# Patient Record
Sex: Female | Born: 1950 | Race: White | Hispanic: No | Marital: Married | Smoking: Never smoker
Health system: Southern US, Community
[De-identification: ages and names within clinical notes are randomized; demographics above are authoritative.]

## PROBLEM LIST (undated history)

## (undated) DIAGNOSIS — E079 Disorder of thyroid, unspecified: Secondary | ICD-10-CM

## (undated) DIAGNOSIS — F32A Depression, unspecified: Secondary | ICD-10-CM

## (undated) DIAGNOSIS — F329 Major depressive disorder, single episode, unspecified: Secondary | ICD-10-CM

## (undated) HISTORY — PX: THYROIDECTOMY: SHX17

---

## 2008-12-11 ENCOUNTER — Ambulatory Visit: Payer: Self-pay | Admitting: Internal Medicine

## 2009-07-03 ENCOUNTER — Ambulatory Visit: Payer: Self-pay | Admitting: Internal Medicine

## 2009-12-07 ENCOUNTER — Ambulatory Visit: Payer: Self-pay | Admitting: Internal Medicine

## 2011-01-18 ENCOUNTER — Ambulatory Visit: Payer: Self-pay | Admitting: Gastroenterology

## 2014-04-07 ENCOUNTER — Ambulatory Visit: Payer: Self-pay | Admitting: Family Medicine

## 2014-11-13 ENCOUNTER — Emergency Department
Admission: EM | Admit: 2014-11-13 | Discharge: 2014-11-13 | Disposition: A | Discharge status: Home / Self Care | Attending: Emergency Medicine | Admitting: Emergency Medicine

## 2014-11-13 ENCOUNTER — Emergency Department

## 2014-11-13 DIAGNOSIS — R202 Paresthesia of skin: Secondary | ICD-10-CM | POA: Insufficient documentation

## 2014-11-13 DIAGNOSIS — R200 Anesthesia of skin: Secondary | ICD-10-CM | POA: Diagnosis not present

## 2014-11-13 DIAGNOSIS — R531 Weakness: Secondary | ICD-10-CM | POA: Diagnosis present

## 2014-11-13 HISTORY — DX: Depression, unspecified: F32.A

## 2014-11-13 HISTORY — DX: Disorder of thyroid, unspecified: E07.9

## 2014-11-13 HISTORY — DX: Major depressive disorder, single episode, unspecified: F32.9

## 2014-11-13 LAB — CBC WITH DIFFERENTIAL/PLATELET
BASOS ABS: 0 10*3/uL (ref 0–0.1)
BASOS PCT: 1 %
EOS ABS: 0 10*3/uL (ref 0–0.7)
EOS PCT: 0 %
HCT: 39 % (ref 35.0–47.0)
HEMOGLOBIN: 13 g/dL (ref 12.0–16.0)
Lymphocytes Relative: 32 %
Lymphs Abs: 1.8 10*3/uL (ref 1.0–3.6)
MCH: 32.3 pg (ref 26.0–34.0)
MCHC: 33.5 g/dL (ref 32.0–36.0)
MCV: 96.6 fL (ref 80.0–100.0)
Monocytes Absolute: 0.6 10*3/uL (ref 0.2–0.9)
Monocytes Relative: 10 %
NEUTROS PCT: 57 %
Neutro Abs: 3.2 10*3/uL (ref 1.4–6.5)
PLATELETS: 317 10*3/uL (ref 150–440)
RBC: 4.03 MIL/uL (ref 3.80–5.20)
RDW: 12.8 % (ref 11.5–14.5)
WBC: 5.7 10*3/uL (ref 3.6–11.0)

## 2014-11-13 LAB — BASIC METABOLIC PANEL
Anion gap: 9 (ref 5–15)
BUN: 12 mg/dL (ref 6–20)
CALCIUM: 9 mg/dL (ref 8.9–10.3)
CHLORIDE: 94 mmol/L — AB (ref 101–111)
CO2: 27 mmol/L (ref 22–32)
CREATININE: 0.86 mg/dL (ref 0.44–1.00)
Glucose, Bld: 96 mg/dL (ref 65–99)
Potassium: 4 mmol/L (ref 3.5–5.1)
SODIUM: 130 mmol/L — AB (ref 135–145)

## 2014-11-13 LAB — TROPONIN I

## 2014-11-13 LAB — TSH: TSH: 3.938 u[IU]/mL (ref 0.350–4.500)

## 2014-11-13 LAB — MAGNESIUM: Magnesium: 2.3 mg/dL (ref 1.7–2.4)

## 2014-11-13 MED ORDER — SODIUM CHLORIDE 0.9 % IV BOLUS (SEPSIS)
500.0000 mL | Freq: Once | INTRAVENOUS | Status: AC
  Administered 2014-11-13: 500 mL via INTRAVENOUS

## 2014-11-13 MED ORDER — LORAZEPAM 1 MG PO TABS: 1.0000 mg | ORAL_TABLET | Freq: Two times a day (BID) | ORAL | Status: DC | PRN

## 2014-11-13 MED ORDER — LORAZEPAM 2 MG/ML IJ SOLN
1.0000 mg | Freq: Once | INTRAMUSCULAR | Status: AC
  Administered 2014-11-13: 1 mg via INTRAVENOUS
  Filled 2014-11-13: qty 1

## 2014-11-13 NOTE — ED Notes (Signed)
MD at bedside. 

## 2014-11-13 NOTE — Discharge Instructions (Signed)

## 2014-11-13 NOTE — ED Provider Notes (Signed)
Time Seen: Approximately ----------------------------------------- 1:06 PM on 11/13/2014 -----------------------------------------    I have reviewed the triage notes  Chief Complaint: Code Stroke   History of Present Illness: Heather Villanueva is a 64 y.o. female who presents with bilateral upper and lower extremity and facial numbness. She states the facial numbness is also bilaterally. She states the numbness and tingling has been occurring for now 2-3 weeks. She states she noticed some subjective weakness today in both upper and lower extremities. She denies any headaches, blurred vision, trouble with speech, or swallowing. She states she has seen her primary physician and was prescribed (for some back discomfort. She has a long history of depression. She denies any suicidal thoughts or homicidal thoughts. She states the weakness in her arms and legs seem to started approximately an hour and a half prior to arrival. She states she feels much better at present in the process of doing her exam.   Past Medical History  Diagnosis Date  . Depression   . Thyroid disease     There are no active problems to display for this patient.   Past Surgical History  Procedure Laterality Date  . Thyroidectomy      Past Surgical History  Procedure Laterality Date  . Thyroidectomy      No current outpatient prescriptions on file.  Allergies:  Review of patient's allergies indicates no known allergies.  Family History: History reviewed. No pertinent family history.  Social History: Social History  Substance Use Topics  . Smoking status: Never Smoker   . Smokeless tobacco: None  . Alcohol Use: No     Review of Systems:   10 point review of systems was performed and was otherwise negative:  Constitutional: No fever Eyes: No visual disturbances ENT: No sore throat, ear pain Cardiac: No chest pain Respiratory: No shortness of breath, wheezing, or stridor Abdomen: No  abdominal pain, no vomiting, No diarrhea Endocrine: No weight loss, No night sweats Extremities: No peripheral edema, cyanosis Skin: No rashes, easy bruising Neurologic: No focal weakness, trouble with speech or swollowing Urologic: No dysuria, Hematuria, or urinary frequency   Physical Exam:  ED Triage Vitals  Enc Vitals Group     BP 11/13/14 1215 160/97 mmHg     Pulse Rate 11/13/14 1218 67     Resp 11/13/14 1218 16     Temp 11/13/14 1218 97.9 F (36.6 C)     Temp Source 11/13/14 1218 Oral     SpO2 11/13/14 1218 99 %     Weight 11/13/14 1218 142 lb (64.411 kg)     Height 11/13/14 1218  (1.676 m)     Head Cir --      Peak Flow --      Pain Score --      Pain Loc --      Pain Edu? --      Excl. in GC? --     General: Awake , Alert , and Oriented times 3; GCS 15. Very anxious Head: Normal cephalic , atraumatic Eyes: Pupils equal , round, reactive to light Nose/Throat: No nasal drainage, patent upper airway without erythema or exudate.  Neck: Supple, Full range of motion, No anterior adenopathy or palpable thyroid masses Lungs: Clear to ascultation without wheezes , rhonchi, or rales Heart: Regular rate, regular rhythm without murmurs , gallops , or rubs Abdomen: Soft, non tender without rebound, guarding , or rigidity; bowel sounds positive and symmetric in all 4 quadrants. No organomegaly .  Extremities: 2 plus symmetric pulses. No edema, clubbing or cyanosis Neurologic: Patient has effort dependent 5 out of 5 strength in both upper and lower extremities which appears to be symmetric. She'll start out by doing a light squeeze on the hand and then with encouragement will squeeze it and flex on both sides. No facial deficits and no sensory deficits are noted. Skin: warm, dry, no rashes   Labs:   All laboratory work was reviewed including any pertinent negatives or positives listed below:  Labs Reviewed  CBC WITH DIFFERENTIAL/PLATELET  BASIC METABOLIC PANEL   TROPONIN I  TSH  MAGNESIUM   review laboratory work showed no significant abnormalities     EKG:  ED ECG REPORT I, Jennye Moccasin, the attending physician, personally viewed and interpreted this ECG.  Date: 11/13/2014 EKG Time: 1222 Rate: 66 Rhythm: normal sinus rhythm QRS Axis: normal Intervals: normal ST/T Wave abnormalities: normal Conduction Disutrbances: none Narrative Interpretation: Normal EKG  Radiology:    EXAM: CT HEAD WITHOUT CONTRAST  TECHNIQUE: Contiguous axial images were obtained from the base of the skull through the vertex without intravenous contrast.  COMPARISON: None.  FINDINGS: No acute intracranial hemorrhage. No focal mass lesion. No CT evidence of acute infarction. No midline shift or mass effect. No hydrocephalus. Basilar cisterns are patent.  There is minimal deep white matter hypodensities.  Paranasal sinuses and mastoid air cells are clear.  IMPRESSION: 1. No acute intracranial findings. 2. Minimal white matter microvascular disease.     I personally reviewed the radiologic studies     ED Course:  The patient's stay here showed some symptomatic improvement with IV Ativan dose she still has some burning and tingling sensation in her peripheries. Patient most likely has some form of paresthesias or peripheral neuropathy  With her symptoms being diffuse I felt this was unlikely to be vascular in nature at this point such as an acute cerebrovascular accident. I felt the patient did not require further inpatient management totally agree with the patient that she'll need neurology follow-up and likely more extensive outpatient testing. Her CT here was negative but there still in her differential includes multiple sclerosis, ALS, transient synovitis, etc. She was advised to return here if she had persistent weakness. She was prescribed Ativan on an outpatient basis for the anxiety component. 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 She was referred to local neurology and advised touch base with her primary physician who may also assist her in outpatient follow-up.   Assessment:  extremity paresthesias     Plan:  Patient was advised to return immediately if condition worsens. Patient was advised to follow up with her primary care physician or other specialized physicians involved and in their current assessment.             Jennye Moccasin, MD 11/13/14 [phone]

## 2014-11-13 NOTE — ED Notes (Signed)
Pt presents via EMS with bilateral lower and upper extremity weakness. States has been having tingling and numbness and waiting for neuro apt in 3 months. States new onset of these symptoms 1.5 hr pta. Denies pain.

## 2014-11-22 ENCOUNTER — Other Ambulatory Visit: Payer: Self-pay | Admitting: Nurse Practitioner

## 2014-11-22 DIAGNOSIS — R531 Weakness: Secondary | ICD-10-CM

## 2014-11-22 DIAGNOSIS — R202 Paresthesia of skin: Secondary | ICD-10-CM

## 2014-11-22 DIAGNOSIS — R200 Anesthesia of skin: Secondary | ICD-10-CM

## 2014-11-30 ENCOUNTER — Ambulatory Visit
Admission: RE | Admit: 2014-11-30 | Discharge: 2014-11-30 | Disposition: A | Discharge status: Home / Self Care | Source: Ambulatory Visit | Attending: Nurse Practitioner | Admitting: Nurse Practitioner

## 2014-11-30 DIAGNOSIS — R202 Paresthesia of skin: Secondary | ICD-10-CM

## 2014-11-30 DIAGNOSIS — R531 Weakness: Secondary | ICD-10-CM

## 2014-11-30 DIAGNOSIS — R200 Anesthesia of skin: Secondary | ICD-10-CM

## 2014-12-14 ENCOUNTER — Ambulatory Visit
Admission: RE | Admit: 2014-12-14 | Discharge: 2014-12-14 | Disposition: A | Discharge status: Home / Self Care | Source: Ambulatory Visit | Attending: Nurse Practitioner | Admitting: Nurse Practitioner

## 2014-12-14 DIAGNOSIS — R200 Anesthesia of skin: Secondary | ICD-10-CM | POA: Diagnosis present

## 2014-12-14 DIAGNOSIS — R202 Paresthesia of skin: Secondary | ICD-10-CM | POA: Insufficient documentation

## 2014-12-14 DIAGNOSIS — R531 Weakness: Secondary | ICD-10-CM | POA: Diagnosis not present

## 2014-12-14 MED ORDER — GADOBENATE DIMEGLUMINE 529 MG/ML IV SOLN
15.0000 mL | Freq: Once | INTRAVENOUS | Status: AC | PRN
  Administered 2014-12-14: 13 mL via INTRAVENOUS

## 2016-01-09 ENCOUNTER — Telehealth: Payer: Self-pay

## 2016-01-09 ENCOUNTER — Ambulatory Visit
Admission: EM | Admit: 2016-01-09 | Discharge: 2016-01-09 | Disposition: A | Discharge status: Home / Self Care | Attending: Family Medicine | Admitting: Family Medicine

## 2016-01-09 DIAGNOSIS — R6889 Other general symptoms and signs: Secondary | ICD-10-CM

## 2016-01-09 DIAGNOSIS — B9789 Other viral agents as the cause of diseases classified elsewhere: Secondary | ICD-10-CM | POA: Diagnosis not present

## 2016-01-09 DIAGNOSIS — J069 Acute upper respiratory infection, unspecified: Secondary | ICD-10-CM | POA: Diagnosis not present

## 2016-01-09 DIAGNOSIS — R69 Illness, unspecified: Secondary | ICD-10-CM

## 2016-01-09 DIAGNOSIS — J111 Influenza due to unidentified influenza virus with other respiratory manifestations: Secondary | ICD-10-CM

## 2016-01-09 LAB — RAPID INFLUENZA A&B ANTIGENS (ARMC ONLY)
INFLUENZA A (ARMC): NEGATIVE
INFLUENZA B (ARMC): NEGATIVE

## 2016-01-09 LAB — RAPID STREP SCREEN (MED CTR MEBANE ONLY): STREPTOCOCCUS, GROUP A SCREEN (DIRECT): NEGATIVE

## 2016-01-09 MED ORDER — MELOXICAM 15 MG PO TABS: 15.0000 mg | ORAL_TABLET | Freq: Every day | ORAL | 1 refills | Status: AC

## 2016-01-09 MED ORDER — OSELTAMIVIR PHOSPHATE 75 MG PO CAPS: 75.0000 mg | ORAL_CAPSULE | Freq: Two times a day (BID) | ORAL | 0 refills | Status: AC

## 2016-01-09 MED ORDER — HYDROCOD POLST-CPM POLST ER 10-8 MG/5ML PO SUER: 5.0000 mL | Freq: Two times a day (BID) | ORAL | 0 refills | Status: AC | PRN

## 2016-01-09 NOTE — ED Triage Notes (Signed)
Flu like symptoms, ear pain, sore throat, body aches, and feels cold all the time. It all started last night.

## 2016-01-09 NOTE — Telephone Encounter (Signed)
Informed patient of negative strep test.

## 2016-01-09 NOTE — ED Provider Notes (Signed)
[facility]    CSN: [id number] Arrival date & time: 01/09/16  1533     History   Chief Complaint Chief Complaint  Patient presents with  . Influenza    HPI Heather Villanueva is a 65 y.o. female.   Patient is here because of flu like symptoms she was fine until yesterday when she started developing aching and general malaise last night she feels worse this morning and feels that this she hurts all over. She did have a flu shot this year but she is coughing sore throat nasal congestion ear pain and just feels miserable. Past smoking history is depression and thyroid disease. Only surgery listed styloidectomy. No known drug allergies and she does not smoke. Past family medical history not significant or relevant to today's visit.   The history is provided by the patient. No language interpreter was used.  Influenza  Presenting symptoms: cough, fatigue, fever, myalgias, rhinorrhea and sore throat   Severity:  Moderate Onset quality:  Sudden Progression:  Worsening Chronicity:  New Relieved by:  Nothing Worsened by:  Nothing Ineffective treatments:  None tried Associated symptoms: chills, decreased appetite, ear pain and nasal congestion     Past Medical History:  Diagnosis Date  . Depression   . Thyroid disease     There are no active problems to display for this patient.   Past Surgical History:  Procedure Laterality Date  . THYROIDECTOMY      OB History    No data available       Home Medications    Prior to Admission medications   Medication Sig Start Date End Date Taking? Authorizing Provider  acetaminophen (TYLENOL) 325 MG tablet Take 650 mg by mouth every 6 (six) hours as needed.   Yes Historical Provider, MD  Genistein-Zn Chelate-Vit D (FOSTEUM PO) Take by mouth.   Yes Historical Provider, MD  levothyroxine (SYNTHROID, LEVOTHROID) 125 MCG tablet Take 125 mcg by mouth daily before breakfast.   Yes Historical Provider, MD    chlorpheniramine-HYDROcodone (TUSSIONEX PENNKINETIC ER) 10-8 MG/5ML SUER Take 5 mLs by mouth every 12 (twelve) hours as needed for cough. 01/09/16   Hassan RowanEugene Aariyah Sampey, MD  meloxicam (MOBIC) 15 MG tablet Take 1 tablet (15 mg total) by mouth daily. 01/09/16   Hassan RowanEugene Sandee Bernath, MD  oseltamivir (TAMIFLU) 75 MG capsule Take 1 capsule (75 mg total) by mouth 2 (two) times daily. 01/09/16   Hassan RowanEugene Zebedee Segundo, MD    Family History History reviewed. No pertinent family history.  Social History Social History  Substance Use Topics  . Smoking status: Never Smoker  . Smokeless tobacco: Never Used  . Alcohol use No     Allergies   Patient has no known allergies.   Review of Systems Review of Systems  Constitutional: Positive for chills, decreased appetite, fatigue and fever.  HENT: Positive for congestion, ear pain, rhinorrhea and sore throat.   Respiratory: Positive for cough.   Musculoskeletal: Positive for myalgias.  All other systems reviewed and are negative.    Physical Exam Triage Vital Signs ED Triage Vitals  Enc Vitals Group     BP 01/09/16 1635 121/69     Pulse Rate 01/09/16 1635 75     Resp 01/09/16 1635 18     Temp 01/09/16 1635 98.1 F (36.7 C)     Temp Source 01/09/16 1635 Oral     SpO2 01/09/16 1635 100 %     Weight 01/09/16 1634 115 lb (52.2 kg)  Height 01/09/16 1634 5\' 6"  (1.676 m)     Head Circumference --      Peak Flow --      Pain Score 01/09/16 1636 0     Pain Loc --      Pain Edu? --      Excl. in GC? --    No data found.   Updated Vital Signs BP 121/69 (BP Location: Left Arm)   Pulse 75   Temp 98.1 F (36.7 C) (Oral)   Resp 18   Ht 5\' 6"  (1.676 m)   Wt 115 lb (52.2 kg)   SpO2 100%   BMI 18.56 kg/m   Visual Acuity Right Eye Distance:   Left Eye Distance:   Bilateral Distance:    Right Eye Near:   Left Eye Near:    Bilateral Near:     Physical Exam  Constitutional: She is oriented to person, place, and time. She appears well-developed and  well-nourished. No distress.  HENT:  Head: Normocephalic and atraumatic.  Right Ear: Hearing, tympanic membrane, external ear and ear canal normal.  Left Ear: Hearing, tympanic membrane, external ear and ear canal normal.  Nose: No mucosal edema or rhinorrhea. Right sinus exhibits no maxillary sinus tenderness and no frontal sinus tenderness. Left sinus exhibits no maxillary sinus tenderness and no frontal sinus tenderness.  Mouth/Throat: Uvula is midline and oropharynx is clear and moist. No oral lesions. No uvula swelling.  Eyes: Pupils are equal, round, and reactive to light.  Neck: Normal range of motion. Neck supple.  Cardiovascular: Normal rate, regular rhythm and normal heart sounds.   Pulmonary/Chest: Effort normal and breath sounds normal.  Musculoskeletal: Normal range of motion.  Neurological: She is alert and oriented to person, place, and time.  Skin: Skin is warm and dry. She is not diaphoretic.  Psychiatric: She has a normal mood and affect.  Vitals reviewed.    UC Treatments / Results  Labs (all labs ordered are listed, but only abnormal results are displayed) Labs Reviewed  RAPID INFLUENZA A&B ANTIGENS ([facility] ONLY)  RAPID STREP SCREEN (NOT AT [facility])  CULTURE, GROUP A STREP [facility])    EKG  EKG Interpretation None       Radiology No results found.  Procedures Procedures (including critical care time)  Medications Ordered in UC Medications - No data to display   Initial Impression / Assessment and Plan / UC Course  I have reviewed the triage vital signs and the nursing notes.  Pertinent labs & imaging results that were available during my care of the patient were reviewed by me and considered in my medical decision making (see chart for details). Results for orders placed or performed during the hospital encounter of 01/09/16  Rapid Influenza A&B Antigens ([facility] only)  Result Value Ref Range   Influenza A ([facility]) NEGATIVE NEGATIVE   Influenza B ([facility])  NEGATIVE NEGATIVE  Rapid strep screen  Result Value Ref Range   Streptococcus, Group A Screen (Direct) NEGATIVE NEGATIVE   Clinical Course     Clinical patient strep test negative flu test negative will treat with Tamiflu and UA place on Tussionex 1 teaspoon twice a day Mobic 15 mg daily. Follow-up PCP if not better in 3-5 days.  Final Clinical Impressions(s) / UC Diagnoses   Final diagnoses:  Flu-like symptoms  Influenza-like illness  Viral upper respiratory tract infection    New Prescriptions Discharge Medication List as of 01/09/2016  5:52 PM    START taking these medications  Details  chlorpheniramine-HYDROcodone (TUSSIONEX PENNKINETIC ER) 10-8 MG/5ML SUER Take 5 mLs by mouth every 12 (twelve) hours as needed for cough., Starting Mon 01/09/2016, Normal    meloxicam (MOBIC) 15 MG tablet Take 1 tablet (15 mg total) by mouth daily., Starting Mon 01/09/2016, Normal    oseltamivir (TAMIFLU) 75 MG capsule Take 1 capsule (75 mg total) by mouth 2 (two) times daily., Starting Mon 01/09/2016, Normal         Hassan Rowan, MD 01/09/16 [phone]

## 2016-01-12 ENCOUNTER — Telehealth: Payer: Self-pay | Admitting: *Deleted

## 2016-01-12 LAB — CULTURE, GROUP A STREP (THRC)

## 2016-01-12 NOTE — Telephone Encounter (Signed)
Called patient, verified DOB, communicated negative strep culture. Patient reported feeling some better, but still having flu symptoms. Patient has followed up with her PCP.

## 2017-06-13 IMAGING — MR MR CERVICAL SPINE WO/W CM
8 of 9 series · 38 of 48 positions shown · IV contrast (13 ML MULTIHANCE)
Comparison: None.

CLINICAL DATA: Numbness and tingling.  Generalized weakness.

EXAM:
MRI CERVICAL SPINE WITHOUT AND WITH CONTRAST
TECHNIQUE: Multiplanar and multiecho pulse sequences of the cervical spine, to
include the craniocervical junction and cervicothoracic junction,
were obtained according to standard protocol without and with
intravenous contrast.
CONTRAST:  13 mL MultiHance IV

[Series 3: T1 · sagittal · 3.0mm · 0.56mm/px · 4 of 13 slices shown (1 of 2)]
[im 1/13]
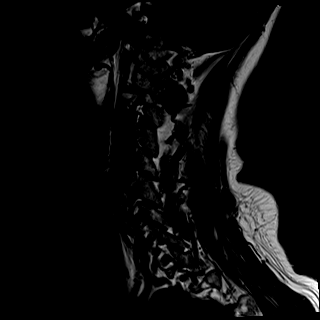
[im 5/13]
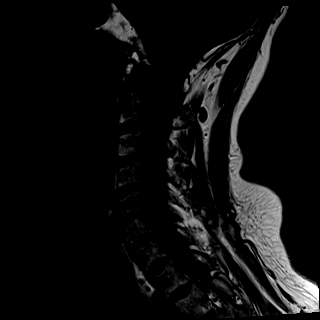
[im 9/13]
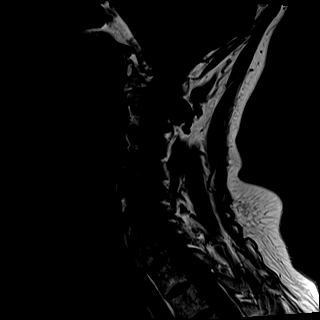
[im 13/13]
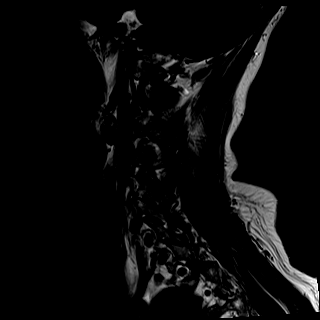

[Series 4: STIR · sagittal · 3.0mm · 0.35mm/px · 4 of 13 slices shown]
[im 1/13]
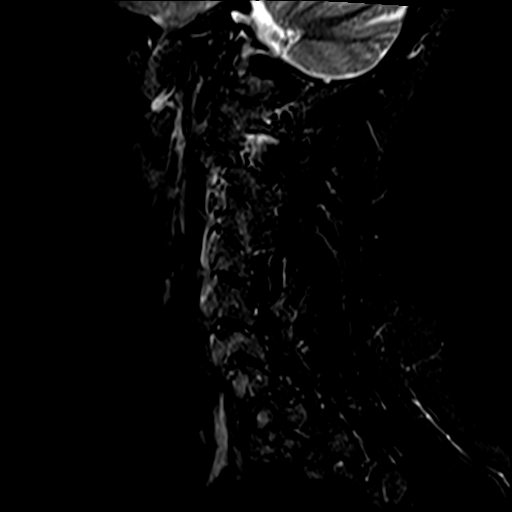
[im 5/13]
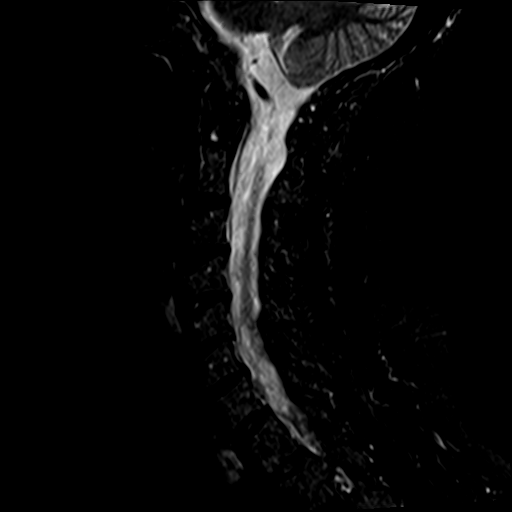
[im 9/13]
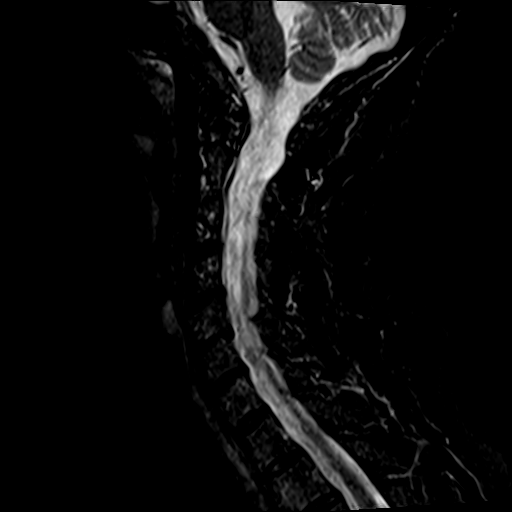
[im 13/13]
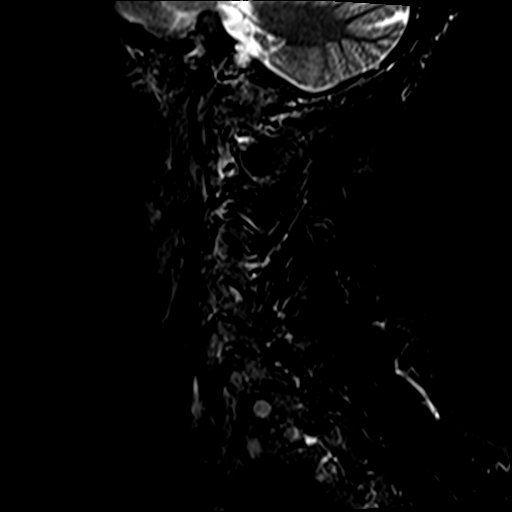

[Series 5: FLAIR · sagittal · 3.0mm · 0.35mm/px · 1 of 13 slices shown]
[im 1/13]
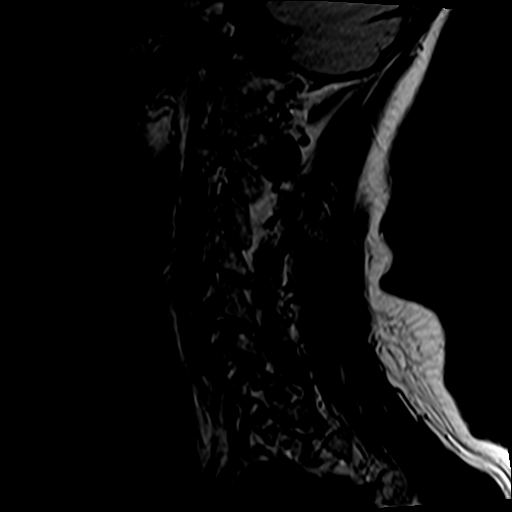

[Series 6: T2 · axial · 3.0mm · 0.62mm/px · z∈[-88,-2]mm · 7 of 25 slices shown (1 of 2)]
[im 1/25]
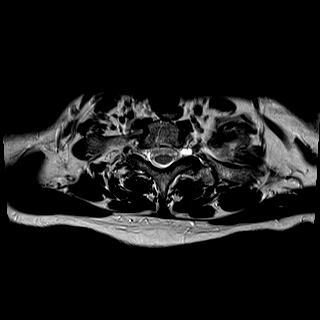
[im 5/25]
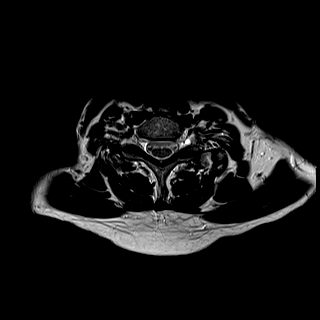
[im 9/25]
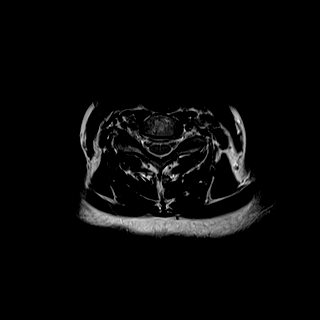
[im 13/25]
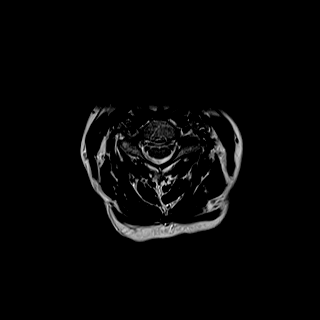
[im 17/25]
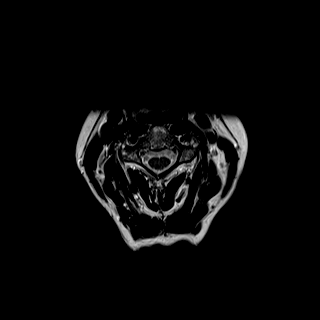
[im 21/25]
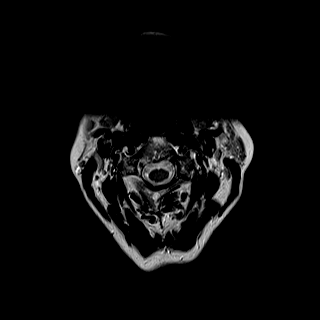
[im 25/25]
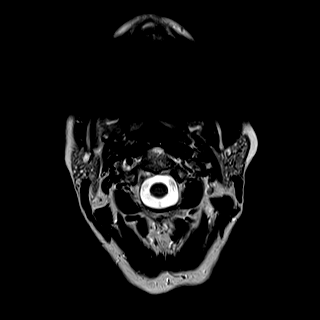

[Series 8: T1 · axial · non-contrast · 3.0mm · 0.62mm/px · z∈[-88,-2]mm · 7 of 25 slices shown (2 of 2)]
[im 1/25]
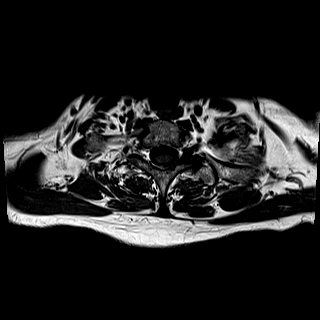
[im 5/25]
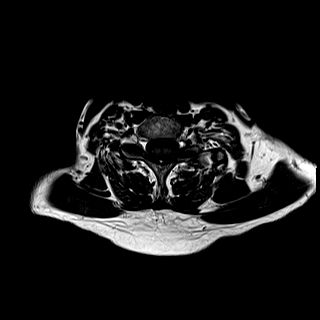
[im 9/25]
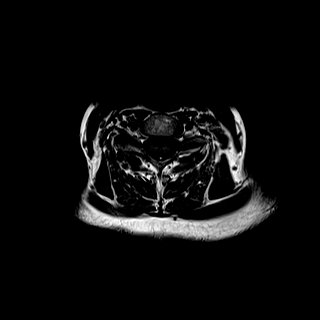
[im 13/25]
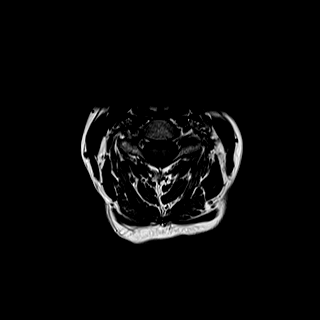
[im 17/25]
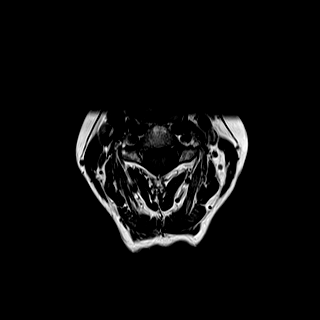
[im 21/25]
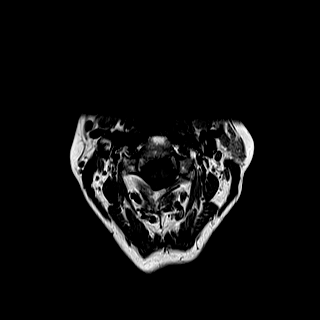
[im 25/25]
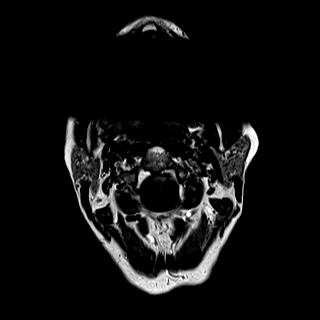

[Series 9: T1 fat-sat post-contrast · sagittal · 3.0mm · 0.56mm/px · 4 of 13 slices shown]
[im 1/13]
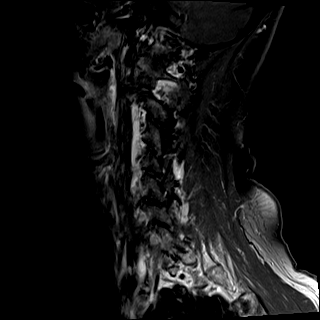
[im 5/13]
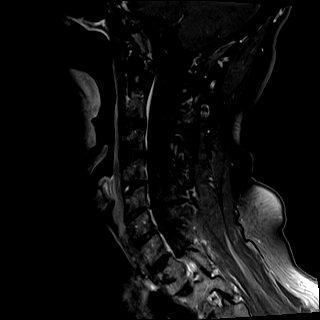
[im 9/13]
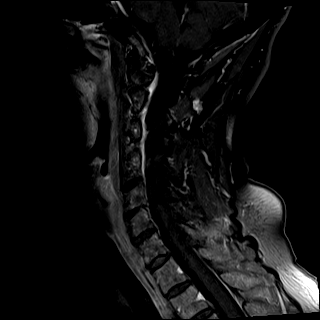
[im 13/13]
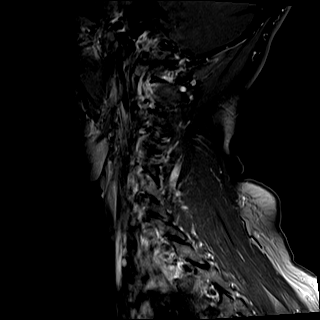

[Series 10: T1 post-contrast · axial · 3.0mm · 0.62mm/px · z∈[-88,-2]mm · 7 of 25 slices shown]
[im 1/25]
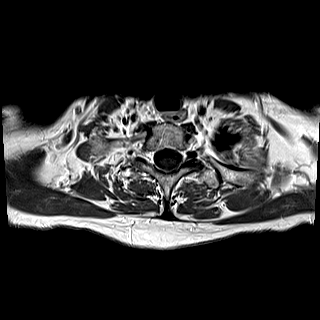
[im 5/25]
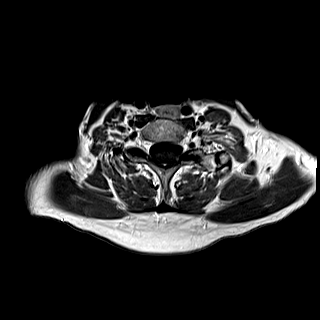
[im 9/25]
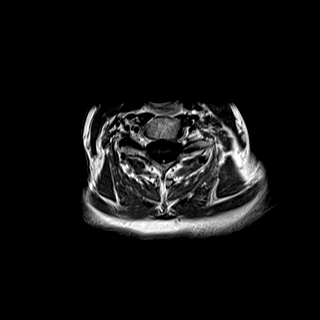
[im 13/25]
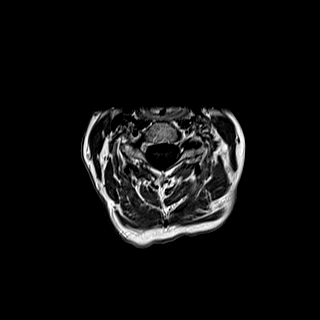
[im 17/25]
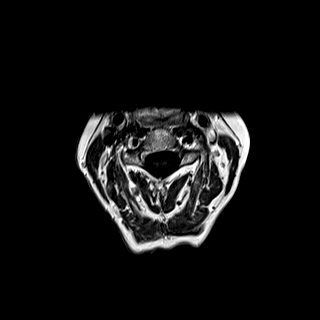
[im 21/25]
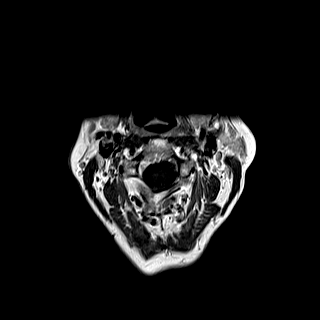
[im 25/25]
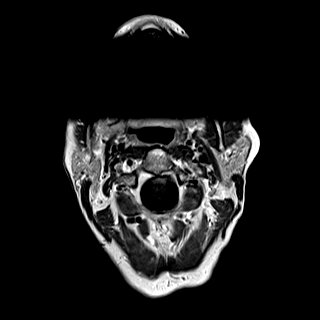

[Series 100: T2 · sagittal · 3.0mm · 0.56mm/px · 4 of 13 slices shown (2 of 2)]
[im 1/13]
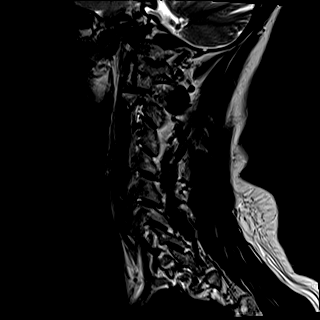
[im 5/13]
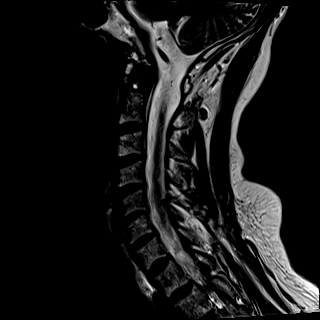
[im 9/13]
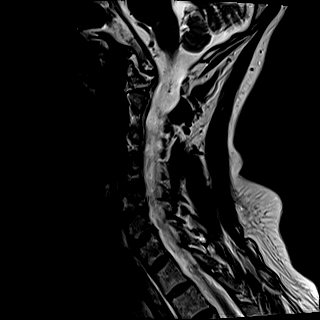
[im 13/13]
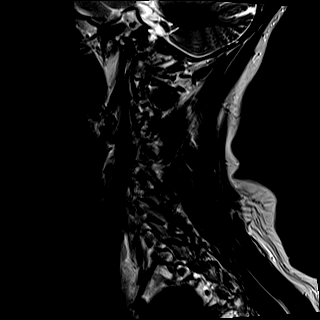

[38 of 48 positions shown; findings below may reference images not displayed]

FINDINGS: Slight retrolisthesis C5-6. Negative for fracture or mass. No bone
marrow edema. No enhancing lesion identified postcontrast.

Spinal cord signal normal. No cord compression or cord lesion.
Normal enhancement of the cord. Cervical medullary junction normal.

C2-3:  Negative

C3-4:  Negative

C4-5:  Negative

C5-6: Mild retrolisthesis. Mild to moderate disc space narrowing.
Early uncinate spurring without spinal or foraminal stenosis

C6-7: Mild disc degeneration with early spurring. No significant
stenosis

C7-T1:  Negative
IMPRESSION: Mild degenerative changes in the cervical spine at C5-6 and C6-7. No
focal disc protrusion or neural impingement.
